# Patient Record
Sex: Female | Born: 1990 | Race: Black or African American | Hispanic: No | Marital: Single | Smoking: Never smoker
Health system: Southern US, Community
[De-identification: ages and names within clinical notes are randomized; demographics above are authoritative.]

## PROBLEM LIST (undated history)

## (undated) DIAGNOSIS — D573 Sickle-cell trait: Secondary | ICD-10-CM

---

## 2012-02-19 ENCOUNTER — Emergency Department: Payer: Self-pay | Admitting: Unknown Physician Specialty

## 2012-02-19 LAB — CBC
HGB: 12.8 g/dL (ref 12.0–16.0)
MCH: 30 pg (ref 26.0–34.0)
MCHC: 35.1 g/dL (ref 32.0–36.0)
Platelet: 238 10*3/uL (ref 150–440)
RBC: 4.26 10*6/uL (ref 3.80–5.20)

## 2014-04-02 ENCOUNTER — Emergency Department: Payer: Self-pay | Admitting: Emergency Medicine

## 2014-04-02 LAB — URINALYSIS, COMPLETE
BILIRUBIN, UR: NEGATIVE
Glucose,UR: NEGATIVE mg/dL (ref 0–75)
Ketone: NEGATIVE
LEUKOCYTE ESTERASE: NEGATIVE
NITRITE: POSITIVE
Ph: 5 (ref 4.5–8.0)
Protein: NEGATIVE
SPECIFIC GRAVITY: 1.016 (ref 1.003–1.030)
Squamous Epithelial: 2

## 2014-04-02 LAB — PREGNANCY, URINE: PREGNANCY TEST, URINE: NEGATIVE m[IU]/mL

## 2014-04-02 LAB — GC/CHLAMYDIA PROBE AMP

## 2014-04-02 LAB — WET PREP, GENITAL

## 2014-12-23 ENCOUNTER — Emergency Department: Payer: Self-pay | Admitting: Emergency Medicine

## 2015-04-21 ENCOUNTER — Emergency Department: Admission: EM | Admit: 2015-04-21 | Discharge: 2015-04-21 | Discharge status: Absent without leave | Payer: Self-pay

## 2015-07-14 ENCOUNTER — Emergency Department
Admission: EM | Admit: 2015-07-14 | Discharge: 2015-07-14 | Disposition: A | Discharge status: Home / Self Care | Payer: Medicaid Other | Attending: Emergency Medicine | Admitting: Emergency Medicine

## 2015-07-14 ENCOUNTER — Encounter: Payer: Self-pay | Admitting: Emergency Medicine

## 2015-07-14 DIAGNOSIS — N390 Urinary tract infection, site not specified: Secondary | ICD-10-CM

## 2015-07-14 DIAGNOSIS — Z3A01 Less than 8 weeks gestation of pregnancy: Secondary | ICD-10-CM | POA: Diagnosis not present

## 2015-07-14 DIAGNOSIS — O9989 Other specified diseases and conditions complicating pregnancy, childbirth and the puerperium: Secondary | ICD-10-CM | POA: Diagnosis present

## 2015-07-14 DIAGNOSIS — Z3491 Encounter for supervision of normal pregnancy, unspecified, first trimester: Secondary | ICD-10-CM

## 2015-07-14 DIAGNOSIS — O2341 Unspecified infection of urinary tract in pregnancy, first trimester: Secondary | ICD-10-CM | POA: Diagnosis not present

## 2015-07-14 LAB — URINALYSIS COMPLETE WITH MICROSCOPIC (ARMC ONLY)
BILIRUBIN URINE: NEGATIVE
Glucose, UA: NEGATIVE mg/dL
Hgb urine dipstick: NEGATIVE
Ketones, ur: NEGATIVE mg/dL
Nitrite: NEGATIVE
PH: 7 (ref 5.0–8.0)
Protein, ur: NEGATIVE mg/dL
Specific Gravity, Urine: 1.011 (ref 1.005–1.030)

## 2015-07-14 LAB — POCT PREGNANCY, URINE: Preg Test, Ur: POSITIVE — AB

## 2015-07-14 MED ORDER — LIDOCAINE HCL (PF) 1 % IJ SOLN
INTRAMUSCULAR | Status: AC
  Administered 2015-07-14: 2.1 mL
  Filled 2015-07-14: qty 5

## 2015-07-14 MED ORDER — CEPHALEXIN 500 MG PO CAPS: 500.0000 mg | ORAL_CAPSULE | Freq: Three times a day (TID) | ORAL | Status: AC

## 2015-07-14 MED ORDER — GNP PRENATAL VITAMINS 28-0.8 MG PO TABS: 1.0000 | ORAL_TABLET | Freq: Every day | ORAL | Status: DC

## 2015-07-14 MED ORDER — CEFTRIAXONE SODIUM 1 G IJ SOLR
1.0000 g | Freq: Once | INTRAMUSCULAR | Status: AC
  Administered 2015-07-14: 1 g via INTRAMUSCULAR
  Filled 2015-07-14: qty 10

## 2015-07-14 NOTE — Discharge Instructions (Signed)
Take keflex three times daily for 3 days.   Take prenatal vitamins.   See your OB doctor.   Return to ER if you have worse abdominal pain, unable to urinate, fever, vomiting.

## 2015-07-14 NOTE — ED Notes (Signed)
MD at bedside to assess patient and use bedside ultrasound.

## 2015-07-14 NOTE — ED Provider Notes (Signed)
CSN: [id number]     Arrival date & time 07/14/15  0919 History   First MD Initiated Contact with Patient 07/14/15 1007     Chief Complaint  Patient presents with  . Abdominal Pain  . Possible Pregnancy     (Consider location/radiation/quality/duration/timing/severity/associated sxs/prior Treatment) The history is provided by the patient.  Laura White is a 24 y.o. female otherwise healthy here presenting with lower abdominal pain, dysuria. For abdominal cramps for the last several days. Also noticed some dysuria and suprapubic pain. Denies any blood in urine or flank pain. LMP beginning of September. Denies vaginal bleeding or discharge. Doesn't know she is pregnant, has been using nuva ring but took it out several days ago since she thought she is going to have a period.      History reviewed. No pertinent past medical history. History reviewed. No pertinent past surgical history. No family history on file. Social History  Substance Use Topics  . Smoking status: Never Smoker   . Smokeless tobacco: None  . Alcohol Use: No   OB History    No data available     Review of Systems  Gastrointestinal: Positive for abdominal pain.  Genitourinary: Positive for dysuria.  All other systems reviewed and are negative.     Allergies  Review of patient's allergies indicates no known allergies.  Home Medications   Prior to Admission medications   Medication Sig Start Date End Date Taking? Authorizing Provider  cephALEXin (KEFLEX) 500 MG capsule Take 1 capsule (500 mg total) by mouth 3 (three) times daily. 07/14/15 07/24/15  Richardean Canal, MD  Prenatal Vit-Fe Fumarate-FA [facility] PRENATAL VITAMINS) 28-0.8 MG TABS Take 1 tablet by mouth daily. 07/14/15   Richardean Canal, MD   BP 120/70 mmHg  Pulse 62  Temp(Src) 98.5 F (36.9 C) (Oral)  Resp 18  Ht  (1.6 m)  Wt 143 lb (64.864 kg)  BMI 25.34 kg/m2  SpO2 100%  LMP 06/12/2015 (Approximate) Physical Exam  Constitutional: She is oriented  to person, place, and time. She appears well-developed and well-nourished.  HENT:  Head: Normocephalic.  Mouth/Throat: Oropharynx is clear and moist.  Eyes: Conjunctivae are normal. Pupils are equal, round, and reactive to light.  Neck: Normal range of motion. Neck supple.  Cardiovascular: Normal rate, regular rhythm and normal heart sounds.   Pulmonary/Chest: Effort normal and breath sounds normal. No respiratory distress. She has no wheezes. She has no rales.  Abdominal: Soft. Bowel sounds are normal.  Minimal suprapubic tenderness. No CVAT   Musculoskeletal: Normal range of motion. She exhibits no edema or tenderness.  Neurological: She is alert and oriented to person, place, and time.  Skin: Skin is warm and dry.  Psychiatric: She has a normal mood and affect. Her behavior is normal. Judgment and thought content normal.  Nursing note and vitals reviewed.   ED Course  Procedures (including critical care time)   EMERGENCY DEPARTMENT [place] PREGNANCY "Study: Limited Ultrasound of the Pelvis"  INDICATIONS:Pregnancy(required) Multiple views of the uterus and pelvic cavity are obtained with a multi-frequency probe.  APPROACH:Transabdominal   PERFORMED BY: Myself  IMAGES ARCHIVED?: Yes  LIMITATIONS: Body habitus  PREGNANCY FREE FLUID: None  PREGNANCY UTERUS FINDINGS:Uterus enlarged and Gestational sac noted ADNEXAL FINDINGS:Left ovary not seen and Right ovary not seen  PREGNANCY FINDINGS: Intrauterine gestational sac noted  INTERPRETATION: Viable intrauterine pregnancy  GESTATIONAL AGE, ESTIMATE: 5 weeks  FETAL HEART RATE: unable  COMMENT(Estimate of Gestational Age):  Labs Review Labs Reviewed  URINALYSIS COMPLETEWITH MICROSCOPIC ([facility] ONLY) - Abnormal; Notable for the following:    Color, Urine YELLOW (*)    APPearance CLEAR (*)    Leukocytes, UA 2+ (*)    Bacteria, UA RARE (*)    Squamous Epithelial / LPF 0-5 (*)    All other components within normal  limits  POCT PREGNANCY, URINE - Abnormal; Notable for the following:    Preg Test, Ur POSITIVE (*)    All other components within normal limits    Imaging Review No results found. I have personally reviewed and evaluated these images and lab results as part of my medical decision-making.   EKG Interpretation None      MDM   Final diagnoses:  UTI (lower urinary tract infection)  First trimester pregnancy    Laura White is a 24 y.o. female here with dysuria. UCG positive. UA + UTI. Bedside US confirmed gestational sac, unable to visualize fetus, likely since she has early pregnancy. No vaginal bleeding or discharge, no signs of threatened abortion. Appears hydrated. No signs of pyelo and vitals stable. Given rocephin shot and will dc with keflex. Recommend prenatal vitamins, will have her go back to see her OB.      Richardean Canal, MD 07/14/15 1159

## 2015-07-14 NOTE — ED Notes (Signed)
Pt wants to confirm pregnancy with testing today and also c/o abd pain. No acute distress noted.

## 2015-07-20 ENCOUNTER — Encounter: Payer: Self-pay | Admitting: *Deleted

## 2015-07-20 ENCOUNTER — Emergency Department
Admission: EM | Admit: 2015-07-20 | Discharge: 2015-07-21 | Disposition: A | Discharge status: Home / Self Care | Payer: Medicaid Other | Attending: Emergency Medicine | Admitting: Emergency Medicine

## 2015-07-20 DIAGNOSIS — Z3A01 Less than 8 weeks gestation of pregnancy: Secondary | ICD-10-CM | POA: Diagnosis not present

## 2015-07-20 DIAGNOSIS — N760 Acute vaginitis: Secondary | ICD-10-CM

## 2015-07-20 DIAGNOSIS — Z792 Long term (current) use of antibiotics: Secondary | ICD-10-CM | POA: Diagnosis not present

## 2015-07-20 DIAGNOSIS — O9989 Other specified diseases and conditions complicating pregnancy, childbirth and the puerperium: Secondary | ICD-10-CM | POA: Diagnosis present

## 2015-07-20 DIAGNOSIS — Z79899 Other long term (current) drug therapy: Secondary | ICD-10-CM | POA: Diagnosis not present

## 2015-07-20 DIAGNOSIS — O23591 Infection of other part of genital tract in pregnancy, first trimester: Secondary | ICD-10-CM | POA: Insufficient documentation

## 2015-07-20 DIAGNOSIS — B9689 Other specified bacterial agents as the cause of diseases classified elsewhere: Secondary | ICD-10-CM

## 2015-07-20 DIAGNOSIS — Z3491 Encounter for supervision of normal pregnancy, unspecified, first trimester: Secondary | ICD-10-CM

## 2015-07-20 DIAGNOSIS — R1030 Lower abdominal pain, unspecified: Secondary | ICD-10-CM

## 2015-07-20 LAB — HCG, QUANTITATIVE, PREGNANCY: hCG, Beta Chain, Quant, S: 65645 m[IU]/mL — ABNORMAL HIGH (ref <5)

## 2015-07-20 LAB — WET PREP, GENITAL
TRICH WET PREP: NONE SEEN
Yeast Wet Prep HPF POC: NONE SEEN

## 2015-07-20 LAB — URINALYSIS COMPLETE WITH MICROSCOPIC (ARMC ONLY)
BACTERIA UA: NONE SEEN
Bilirubin Urine: NEGATIVE
Glucose, UA: NEGATIVE mg/dL
Hgb urine dipstick: NEGATIVE
KETONES UR: NEGATIVE mg/dL
Nitrite: NEGATIVE
PH: 6 (ref 5.0–8.0)
Protein, ur: NEGATIVE mg/dL
RBC / HPF: NONE SEEN RBC/hpf (ref 0–5)
Specific Gravity, Urine: 1.011 (ref 1.005–1.030)

## 2015-07-20 MED ORDER — METRONIDAZOLE 500 MG PO TABS: 500.0000 mg | ORAL_TABLET | Freq: Two times a day (BID) | ORAL | Status: DC

## 2015-07-20 NOTE — ED Provider Notes (Signed)
[facility] Emergency Department Provider Note  ____________________________________________  Time seen: Approximately 9:24 PM  I have reviewed the triage vital signs and the nursing notes.   HISTORY  Chief Complaint Abdominal Pain    HPI Laura White is a 24 y.o. female G7P2Ab4 (1 spontaneous, 3 elective) at approximately 5 weeks who presents with intermittent sharp severe pelvic pains and persistent pressure.  She was seen several days ago and diagnosed with urinary tract infection.  She has completed a course of antibiotics but continues feeling somewhat.  She denies vaginal bleeding or discharge.  She has not been able to see an OB doctor because they will not see her until she is at 8 weeks.  She denies fever/chills, chest pain, shortness of breath, nausea/vomiting, dysuria (it resolved after the Keflex).  She states that the symptoms of a persistent for several days though they occur intermittently, particularly the sharp pain.  Nothing makes it better and nothing makes it worse.   History reviewed. No pertinent past medical history.  There are no active problems to display for this patient.   Past Surgical History  Procedure Laterality Date  . Cesarean section      Current Outpatient Rx  Name  Route  Sig  Dispense  Refill  . cephALEXin (KEFLEX) 500 MG capsule   Oral   Take 1 capsule (500 mg total) by mouth 3 (three) times daily.   15 capsule   0   . metroNIDAZOLE (FLAGYL) 500 MG tablet   Oral   Take 1 tablet (500 mg total) by mouth 2 (two) times daily.   14 tablet   0   . Prenatal Vit-Fe Fumarate-FA ([facility] PRENATAL VITAMINS) 28-0.8 MG TABS   Oral   Take 1 tablet by mouth daily.   30 tablet   0     Allergies Review of patient's allergies indicates no known allergies.  No family history on file.  Social History Social History  Substance Use Topics  . Smoking status: Never Smoker   . Smokeless tobacco: None  . Alcohol Use: No     Review of Systems Constitutional: No fever/chills Eyes: No visual changes. ENT: No sore throat. Cardiovascular: Denies chest pain. Respiratory: Denies shortness of breath. Gastrointestinal: Intermittent sharp pelvic or lower abdominal pain.  No nausea, no vomiting.  No diarrhea.  No constipation. Genitourinary: Negative for dysuria.  Denies vaginal bleeding and vaginal discharge Musculoskeletal: Negative for back pain. Skin: Negative for rash. Neurological: Negative for headaches, focal weakness or numbness.  10-point ROS otherwise negative.  ____________________________________________   PHYSICAL EXAM:  VITAL SIGNS: ED Triage Vitals  Enc Vitals Group     BP 07/20/15 1827 120/60 mmHg     Pulse Rate 07/20/15 1827 74     Resp 07/20/15 1827 18     Temp 07/20/15 1827 99.1 F (37.3 C)     Temp Source 07/20/15 1827 Oral     SpO2 07/20/15 1827 99 %     Weight 07/20/15 1827 143 lb (64.864 kg)     Height 07/20/15 1827 5\' 3"  (1.6 m)     Head Cir --      Peak Flow --      Pain Score 07/20/15 1827 6     Pain Loc --      Pain Edu? --      Excl. in GC? --     Constitutional: Alert and oriented. Well appearing and in no acute distress. Eyes: Conjunctivae are normal. PERRL. EOMI. Head:  Atraumatic. Nose: No congestion/rhinnorhea. Mouth/Throat: Mucous membranes are moist.  Oropharynx non-erythematous. Neck: No stridor.   Cardiovascular: Normal rate, regular rhythm. Grossly normal heart sounds.  Good peripheral circulation. Respiratory: Normal respiratory effort.  No retractions. Lungs CTAB. Gastrointestinal: Soft and nontender. No distention. No abdominal bruits. No CVA tenderness. Genitourinary: Normal external exam.  Moderate amount of whitish discharge.  No cervicitis.  Cervix is closed.  No blood.  Normal bimanual exam with no cervical motion tenderness nor adnexal tenderness. Musculoskeletal: No lower extremity tenderness nor edema.  No joint effusions. Neurologic:  Normal  speech and language. No gross focal neurologic deficits are appreciated.  Skin:  Skin is warm, dry and intact. No rash noted. Psychiatric: Mood and affect are normal. Speech and behavior are normal.  ____________________________________________   LABS (all labs ordered are listed, but only abnormal results are displayed)  Labs Reviewed  WET PREP, GENITAL - Abnormal; Notable for the following:    Clue Cells Wet Prep HPF POC MODERATE (*)    WBC, Wet Prep HPF POC MODERATE (*)    All other components within normal limits  URINALYSIS COMPLETEWITH MICROSCOPIC ([facility] ONLY) - Abnormal; Notable for the following:    Color, Urine STRAW (*)    APPearance CLEAR (*)    Leukocytes, UA TRACE (*)    Squamous Epithelial / LPF 6-30 (*)    All other components within normal limits  HCG, QUANTITATIVE, PREGNANCY - Abnormal; Notable for the following:    hCG, Beta Chain, Quant, [place] [address] (*)    All other components within normal limits  CHLAMYDIA/NGC RT PCR ([facility] ONLY)  URINE CULTURE   ____________________________________________  EKG  Not indicated ____________________________________________  RADIOLOGY   No results found.  ____________________________________________   PROCEDURES  Procedure(s) performed: None  Critical Care performed: No ____________________________________________   INITIAL IMPRESSION / ASSESSMENT AND PLAN / ED COURSE  Pertinent labs & imaging results that were available during my care of the patient were reviewed by me and considered in my medical decision making (see chart for details).  The patient is well-appearing and in no acute distress with normal vitals.  Given that this is a repeat visit with some persistent issues in the setting of an apparently resolving urinary tract infection, I will do a pelvic exam and obtain swabs and will get an official transvaginal ultrasound after checking the hCG.  ----------------------------------------- 11:45 PM  on 07/20/2015 -----------------------------------------  Transferring care to Dr. Dolores Frame to follow-up GC/Chlamydia results and ultrasound report. ____________________________________________  FINAL CLINICAL IMPRESSION(S) / ED DIAGNOSES  Final diagnoses:  Bacterial vaginosis  Lower abdominal pain      NEW MEDICATIONS STARTED DURING THIS VISIT:  New Prescriptions   METRONIDAZOLE (FLAGYL) 500 MG TABLET    Take 1 tablet (500 mg total) by mouth 2 (two) times daily.     Loleta Rose, MD 07/20/15 [phone]

## 2015-07-20 NOTE — ED Notes (Signed)
Pt reports lower midline abdominal pain, since Friday, "feels like more pressure and sharp pains". Dx with UTI on the 7th, has completed abx prescriptions. Pt denies vaginal bleeding or discharge. Pt estimates she is 4-[redacted] weeks pregnant.

## 2015-07-21 ENCOUNTER — Emergency Department: Payer: Medicaid Other

## 2015-07-21 LAB — CHLAMYDIA/NGC RT PCR (ARMC ONLY)
CHLAMYDIA TR: NOT DETECTED
N gonorrhoeae: NOT DETECTED

## 2015-07-21 NOTE — Discharge Instructions (Signed)
1. Take antibiotic as prescribed (Flagyl 500 mg twice daily 7 days). 2. Return to the ER for worsening symptoms, vaginal bleeding, persistent vomiting or other concerns.  Bacterial Vaginosis Bacterial vaginosis is a vaginal infection that occurs when the normal balance of bacteria in the vagina is disrupted. It results from an overgrowth of certain bacteria. This is the most common vaginal infection in women of childbearing age. Treatment is important to prevent complications, especially in pregnant women, as it can cause a premature delivery. CAUSES  Bacterial vaginosis is caused by an increase in harmful bacteria that are normally present in smaller amounts in the vagina. Several different kinds of bacteria can cause bacterial vaginosis. However, the reason that the condition develops is not fully understood. RISK FACTORS Certain activities or behaviors can put you at an increased risk of developing bacterial vaginosis, including:  Having a new sex partner or multiple sex partners.  Douching.  Using an intrauterine device (IUD) for contraception. Women do not get bacterial vaginosis from toilet seats, bedding, swimming pools, or contact with objects around them. SIGNS AND SYMPTOMS  Some women with bacterial vaginosis have no signs or symptoms. Common symptoms include:  Grey vaginal discharge.  A fishlike odor with discharge, especially after sexual intercourse.  Itching or burning of the vagina and vulva.  Burning or pain with urination. DIAGNOSIS  Your health care provider will take a medical history and examine the vagina for signs of bacterial vaginosis. A sample of vaginal fluid may be taken. Your health care provider will look at this sample under a microscope to check for bacteria and abnormal cells. A vaginal pH test may also be done.  TREATMENT  Bacterial vaginosis may be treated with antibiotic medicines. These may be given in the form of a pill or a vaginal cream. A second  round of antibiotics may be prescribed if the condition comes back after treatment. Because bacterial vaginosis increases your risk for sexually transmitted diseases, getting treated can help reduce your risk for chlamydia, gonorrhea, HIV, and herpes. HOME CARE INSTRUCTIONS   Only take over-the-counter or prescription medicines as directed by your health care provider.  If antibiotic medicine was prescribed, take it as directed. Make sure you finish it even if you start to feel better.  Tell all sexual partners that you have a vaginal infection. They should see their health care provider and be treated if they have problems, such as a mild rash or itching.  During treatment, it is important that you follow these instructions:  Avoid sexual activity or use condoms correctly.  Do not douche.  Avoid alcohol as directed by your health care provider.  Avoid breastfeeding as directed by your health care provider. SEEK MEDICAL CARE IF:   Your symptoms are not improving after 3 days of treatment.  You have increased discharge or pain.  You have a fever. MAKE SURE YOU:   Understand these instructions.  Will watch your condition.  Will get help right away if you are not doing well or get worse. FOR MORE INFORMATION  Centers for Disease Control and Prevention, Division of STD Prevention: [employer]s.[employer].za American Sexual Health Association (ASHA): www.ashastd.org    This information is not intended to replace advice given to you by your health care provider. Make sure you discuss any questions you have with your health care provider.   Document Released: 09/23/2005 Document Revised: 10/14/2014 Document Reviewed: 05/05/2013 Elsevier Interactive Patient Education 2016 [employer].  First Trimester of Pregnancy The first trimester of  pregnancy is from week 1 until the end of week 12 (months 1 through 3). During this time, your baby will begin to develop inside you. At 6-8 weeks, the eyes  and face are formed, and the heartbeat can be seen on ultrasound. At the end of 12 weeks, all the baby's organs are formed. Prenatal care is all the medical care you receive before the birth of your baby. Make sure you get good prenatal care and follow all of your doctor's instructions. HOME CARE  Medicines  Take medicine only as told by your doctor. Some medicines are safe and some are not during pregnancy.  Take your prenatal vitamins as told by your doctor.  Take medicine that helps you poop (stool softener) as needed if your doctor says it is okay. Diet  Eat regular, healthy meals.  Your doctor will tell you the amount of weight gain that is right for you.  Avoid raw meat and uncooked cheese.  If you feel sick to your stomach (nauseous) or throw up (vomit):  Eat 4 or 5 small meals a day instead of 3 large meals.  Try eating a few soda crackers.  Drink liquids between meals instead of during meals.  If you have a hard time pooping (constipation):  Eat high-fiber foods like fresh vegetables, fruit, and whole grains.  Drink enough fluids to keep your pee (urine) clear or pale yellow. Activity and Exercise  Exercise only as told by your doctor. Stop exercising if you have cramps or pain in your lower belly (abdomen) or low back.  Try to avoid standing for long periods of time. Move your legs often if you must stand in one place for a long time.  Avoid heavy lifting.  Wear low-heeled shoes. Sit and stand up straight.  You can have sex unless your doctor tells you not to. Relief of Pain or Discomfort  Wear a good support bra if your breasts are sore.  Take warm water baths (sitz baths) to soothe pain or discomfort caused by hemorrhoids. Use hemorrhoid cream if your doctor says it is okay.  Rest with your legs raised if you have leg cramps or low back pain.  Wear support hose if you have puffy, bulging veins (varicose veins) in your legs. Raise (elevate) your feet for 15  minutes, 3-4 times a day. Limit salt in your diet. Prenatal Care  Schedule your prenatal visits by the twelfth week of pregnancy.  Write down your questions. Take them to your prenatal visits.  Keep all your prenatal visits as told by your doctor. Safety  Wear your seat belt at all times when driving.  Make a list of emergency phone numbers. The list should include numbers for family, friends, the hospital, and police and fire departments. General Tips  Ask your doctor for a referral to a local prenatal class. Begin classes no later than at the start of month 6 of your pregnancy.  Ask for help if you need counseling or help with nutrition. Your doctor can give you advice or tell you where to go for help.  Do not use hot tubs, steam rooms, or saunas.  Do not douche or use tampons or scented sanitary pads.  Do not cross your legs for long periods of time.  Avoid litter boxes and soil used by cats.  Avoid all smoking, herbs, and alcohol. Avoid drugs not approved by your doctor.  Do not use any tobacco products, including cigarettes, chewing tobacco, and electronic cigarettes. If you  need help quitting, ask your doctor. You may get counseling or other support to help you quit.  Visit your dentist. At home, brush your teeth with a soft toothbrush. Be gentle when you floss. GET HELP IF:  You are dizzy.  You have mild cramps or pressure in your lower belly.  You have a nagging pain in your belly area.  You continue to feel sick to your stomach, throw up, or have watery poop (diarrhea).  You have a bad smelling fluid coming from your vagina.  You have pain with peeing (urination).  You have increased puffiness (swelling) in your face, hands, legs, or ankles. GET HELP RIGHT AWAY IF:   You have a fever.  You are leaking fluid from your vagina.  You have spotting or bleeding from your vagina.  You have very bad belly cramping or pain.  You gain or lose weight  rapidly.  You throw up blood. It may look like coffee grounds.  You are around people who have [place] measles, fifth disease, or chickenpox.  You have a very bad headache.  You have shortness of breath.  You have any kind of trauma, such as from a fall or a car accident.   This information is not intended to replace advice given to you by your health care provider. Make sure you discuss any questions you have with your health care provider.   Document Released: 03/11/2008 Document Revised: 10/14/2014 Document Reviewed: 08/03/2013 Elsevier Interactive Patient Education 2016 Elsevier Inc.  Abdominal Pain, Adult Many things can cause abdominal pain. Usually, abdominal pain is not caused by a disease and will improve without treatment. It can often be observed and treated at home. Your health care provider will do a physical exam and possibly order blood tests and X-rays to help determine the seriousness of your pain. However, in many cases, more time must pass before a clear cause of the pain can be found. Before that point, your health care provider may not know if you need more testing or further treatment. HOME CARE INSTRUCTIONS Monitor your abdominal pain for any changes. The following actions may help to alleviate any discomfort you are experiencing:  Only take over-the-counter or prescription medicines as directed by your health care provider.  Do not take laxatives unless directed to do so by your health care provider.  Try a clear liquid diet (broth, tea, or water) as directed by your health care provider. Slowly move to a bland diet as tolerated. SEEK MEDICAL CARE IF:  You have unexplained abdominal pain.  You have abdominal pain associated with nausea or diarrhea.  You have pain when you urinate or have a bowel movement.  You experience abdominal pain that wakes you in the night.  You have abdominal pain that is worsened or improved by eating food.  You have abdominal  pain that is worsened with eating fatty foods.  You have a fever. SEEK IMMEDIATE MEDICAL CARE IF:  Your pain does not go away within 2 hours.  You keep throwing up (vomiting).  Your pain is felt only in portions of the abdomen, such as the right side or the left lower portion of the abdomen.  You pass bloody or black tarry stools. MAKE SURE YOU:  Understand these instructions.  Will watch your condition.  Will get help right away if you are not doing well or get worse.   This information is not intended to replace advice given to you by your health care provider. Make sure  you discuss any questions you have with your health care provider.   Document Released: 07/03/2005 Document Revised: 06/14/2015 Document Reviewed: 06/02/2013 Elsevier Interactive Patient Education [employer]

## 2015-07-21 NOTE — ED Provider Notes (Signed)
-----------------------------------------   2:23 AM on 07/21/2015 -----------------------------------------  Chlamydia and gonorrhea results negative.  Ultrasound OB interpreted per Dr. Andria MeuseStevens: Single intrauterine pregnancy identified. Estimated gestational age by crown-rump length is 5 weeks 6 days. No acute complication is Suggested.  Updated patient of laboratory and ultrasound results. Strict return precautions given. Patient verbalizes understanding and agrees with plan of care.  [name], MD 07/21/15 [phone]

## 2015-07-22 LAB — URINE CULTURE
Culture: NO GROWTH
Special Requests: NORMAL

## 2015-08-15 DIAGNOSIS — D573 Sickle-cell trait: Secondary | ICD-10-CM | POA: Insufficient documentation

## 2015-09-02 ENCOUNTER — Encounter: Payer: Self-pay | Admitting: Emergency Medicine

## 2015-09-02 ENCOUNTER — Emergency Department
Admission: EM | Admit: 2015-09-02 | Discharge: 2015-09-02 | Discharge status: Against Medical Advice | Payer: Medicaid Other | Attending: Emergency Medicine | Admitting: Emergency Medicine

## 2015-09-02 DIAGNOSIS — O209 Hemorrhage in early pregnancy, unspecified: Secondary | ICD-10-CM | POA: Diagnosis not present

## 2015-09-02 DIAGNOSIS — Z3A11 11 weeks gestation of pregnancy: Secondary | ICD-10-CM | POA: Diagnosis not present

## 2015-09-02 HISTORY — DX: Sickle-cell trait: D57.3

## 2015-09-02 LAB — URINALYSIS COMPLETE WITH MICROSCOPIC (ARMC ONLY)
BILIRUBIN URINE: NEGATIVE
Bacteria, UA: NONE SEEN
Glucose, UA: NEGATIVE mg/dL
HGB URINE DIPSTICK: NEGATIVE
KETONES UR: NEGATIVE mg/dL
LEUKOCYTES UA: NEGATIVE
NITRITE: NEGATIVE
PH: 7 (ref 5.0–8.0)
Protein, ur: NEGATIVE mg/dL
SPECIFIC GRAVITY, URINE: 1.013 (ref 1.005–1.030)

## 2015-09-02 NOTE — ED Notes (Signed)
Called x 3 from waiting room, no answer. Appears to have LWBS.

## 2015-09-02 NOTE — ED Notes (Signed)
Pt states she is approximately [redacted] weeks pregnant and started bleeding this morning while at work. Pt states "I felt one gush this morning and then it has been when I wipe since then." Pt denies abdominal cramping but does complain of lower back pain.

## 2015-10-21 ENCOUNTER — Emergency Department
Admission: EM | Admit: 2015-10-21 | Discharge: 2015-10-21 | Disposition: A | Discharge status: Home / Self Care | Payer: Medicaid Other | Attending: Emergency Medicine | Admitting: Emergency Medicine

## 2015-10-21 ENCOUNTER — Encounter: Payer: Self-pay | Admitting: Emergency Medicine

## 2015-10-21 DIAGNOSIS — Z79899 Other long term (current) drug therapy: Secondary | ICD-10-CM | POA: Diagnosis not present

## 2015-10-21 DIAGNOSIS — Z3A19 19 weeks gestation of pregnancy: Secondary | ICD-10-CM | POA: Insufficient documentation

## 2015-10-21 DIAGNOSIS — O4692 Antepartum hemorrhage, unspecified, second trimester: Secondary | ICD-10-CM

## 2015-10-21 DIAGNOSIS — O209 Hemorrhage in early pregnancy, unspecified: Secondary | ICD-10-CM | POA: Diagnosis not present

## 2015-10-21 LAB — HCG, QUANTITATIVE, PREGNANCY: HCG, BETA CHAIN, QUANT, S: 58506 m[IU]/mL — AB (ref <5)
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
## 2015-10-21 NOTE — Discharge Instructions (Signed)
Vaginal Bleeding During Pregnancy, Second Trimester A small amount of bleeding (spotting) from the vagina is relatively common in pregnancy. It usually stops on its own. Various things can cause bleeding or spotting in pregnancy. Some bleeding may be related to the pregnancy, and some may not. Sometimes the bleeding is normal and is not a problem. However, bleeding can also be a sign of something serious. Be sure to tell your health care provider about any vaginal bleeding right away. Some possible causes of vaginal bleeding during the second trimester include:  Infection, inflammation, or growths on the cervix.   The placenta may be partially or completely covering the opening of the cervix inside the uterus (placenta previa).  The placenta may have separated from the uterus (abruption of the placenta).   You may be having early (preterm) labor.   The cervix may not be strong enough to keep a baby inside the uterus (cervical insufficiency).   Tiny cysts may have developed in the uterus instead of pregnancy tissue (molar pregnancy). HOME CARE INSTRUCTIONS  Watch your condition for any changes. The following actions may help to lessen any discomfort you are feeling:  Follow your health care provider's instructions for limiting your activity. If your health care provider orders bed rest, you may need to stay in bed and only get up to use the bathroom. However, your health care provider may allow you to continue light activity.  If needed, make plans for someone to help with your regular activities and responsibilities while you are on bed rest.  Keep track of the number of pads you use each day, how often you change pads, and how soaked (saturated) they are. Write this down.  Do not use tampons. Do not douche.  Do not have sexual intercourse or orgasms until approved by your health care provider.  If you pass any tissue from your vagina, save the tissue so you can show it to your  health care provider.  Only take over-the-counter or prescription medicines as directed by your health care provider.  Do not take aspirin because it can make you bleed.  Do not exercise or perform any strenuous activities or heavy lifting without your health care provider's permission.  Keep all follow-up appointments as directed by your health care provider. SEEK MEDICAL CARE IF:  You have any vaginal bleeding during any part of your pregnancy.  You have cramps or labor pains.  You have a fever, not controlled by medicine. SEEK IMMEDIATE MEDICAL CARE IF:   You have severe cramps in your back or belly (abdomen).  You have contractions.  You have chills.  You pass large clots or tissue from your vagina.  Your bleeding increases.  You feel light-headed or weak, or you have fainting episodes.  You are leaking fluid or have a gush of fluid from your vagina. MAKE SURE YOU:  Understand these instructions.  Will watch your condition.  Will get help right away if you are not doing well or get worse.   This information is not intended to replace advice given to you by your health care provider. Make sure you discuss any questions you have with your health care provider.   Document Released: 07/03/2005 Document Revised: 09/28/2013 Document Reviewed: 05/31/2013 Elsevier Interactive Patient Education 2016 Elsevier Inc.  
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
## 2016-09-19 IMAGING — US US OB TRANSVAGINAL
1 series · 13 of 28 positions shown · non-contrast
Comparison: None.

CLINICAL DATA: Lower midline abdominal pain during pregnancy.
Patient was seen in the ER 6 days ago for urinary tract infection.
Estimated gestational age by LMP is 5 weeks 4 days. Quantitative
beta HCG is [DATE].

EXAM:
OBSTETRIC <14 WK US AND TRANSVAGINAL OB US
TECHNIQUE: Both transabdominal and transvaginal ultrasound examinations were
performed for complete evaluation of the gestation as well as the
maternal uterus, adnexal regions, and pelvic cul-de-sac.
Transvaginal technique was performed to assess early pregnancy.

[Series 1: us ob transvaginal · 0.20mm/px · 13 of 78 slices shown]
[im 3/78]
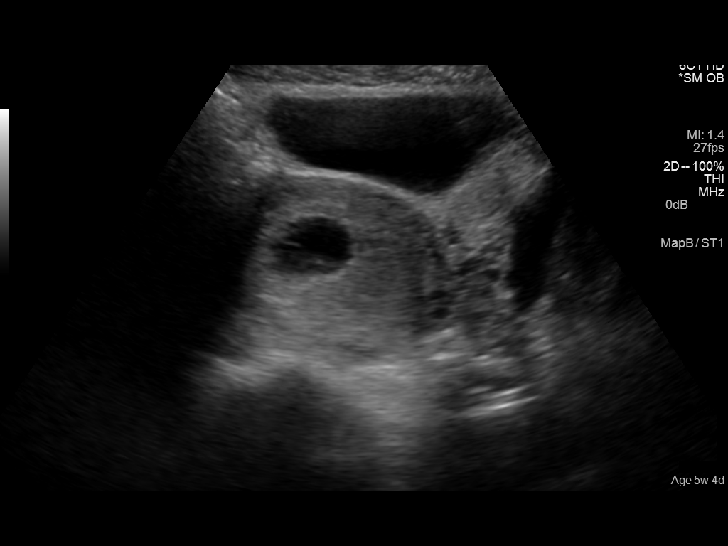
[im 9/78]
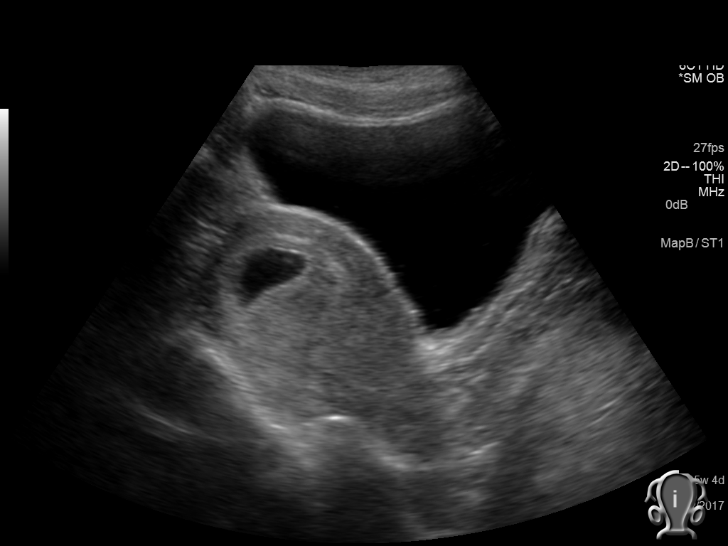
[im 15/78]
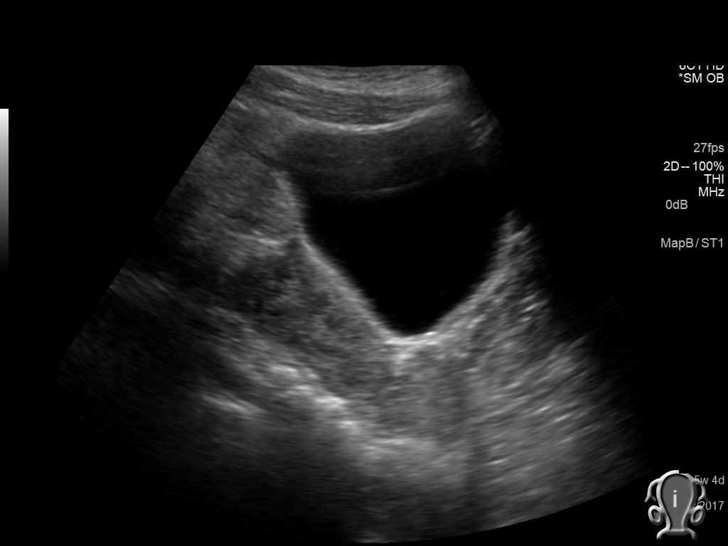
[im 20/78]
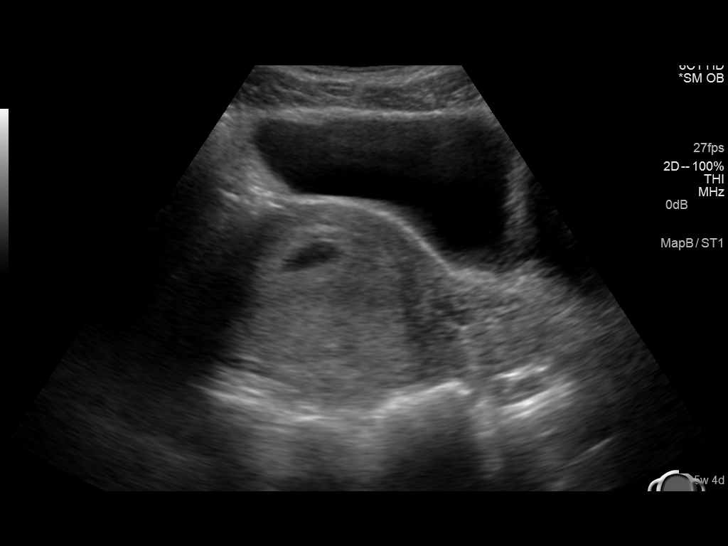
[im 26/78]
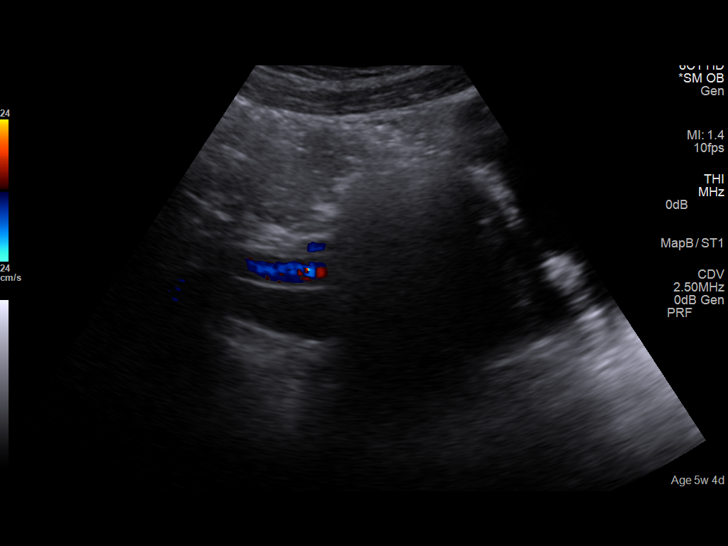
[im 32/78]
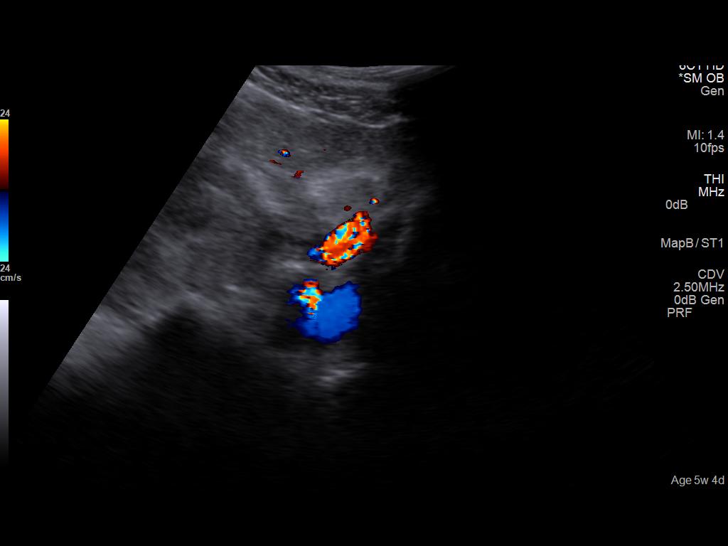
[im 40/78]
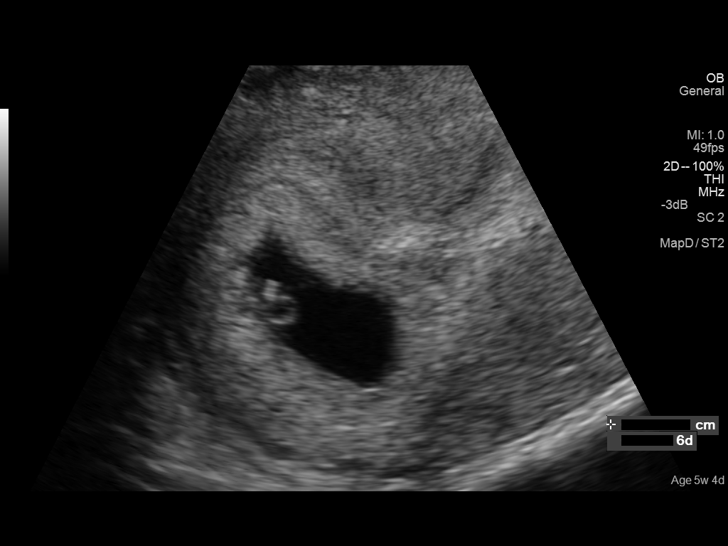
[im 46/78]
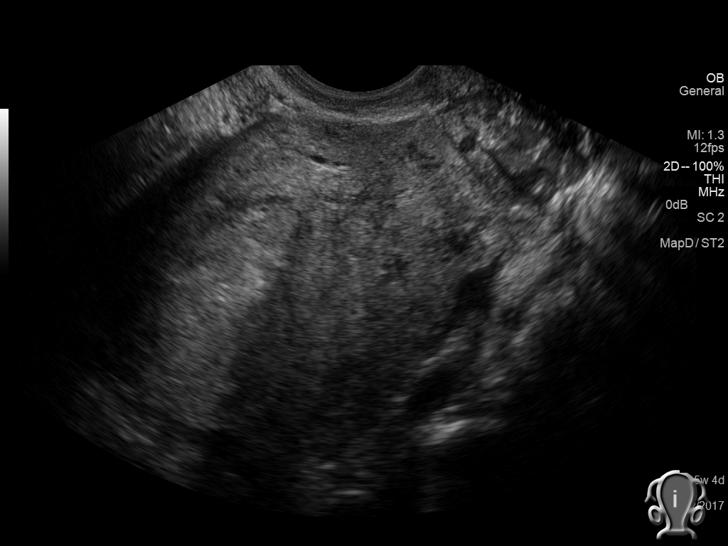
[im 52/78]
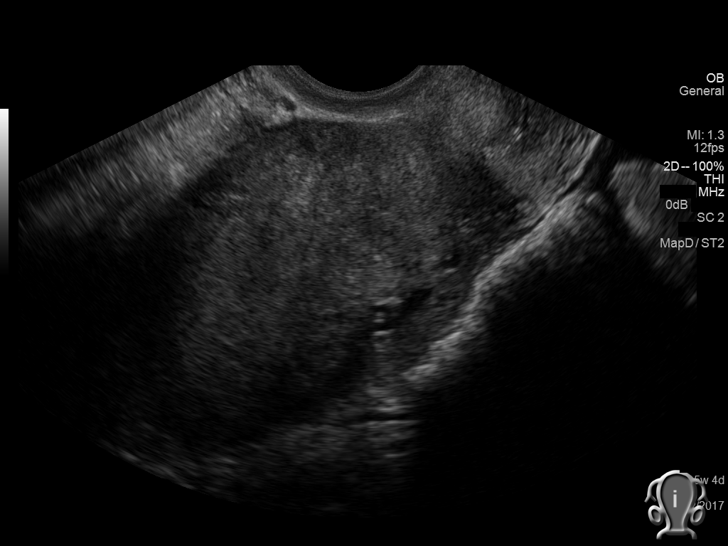
[im 58/78]
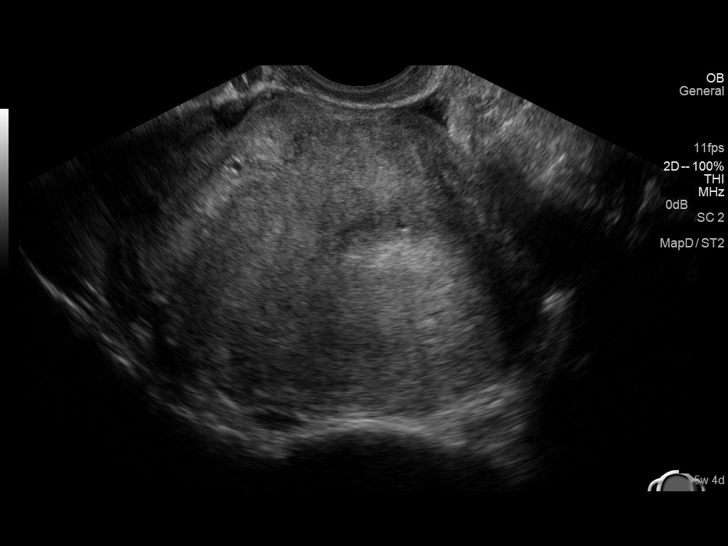
[im 63/78]
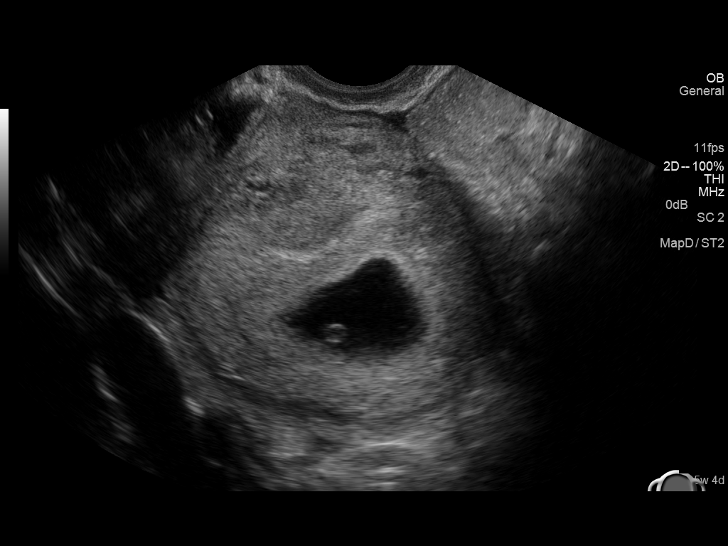
[im 69/78]
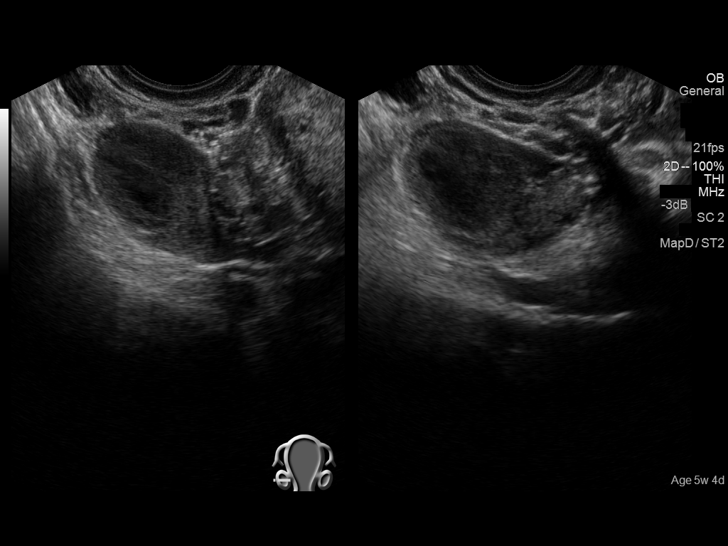
[im 75/78]
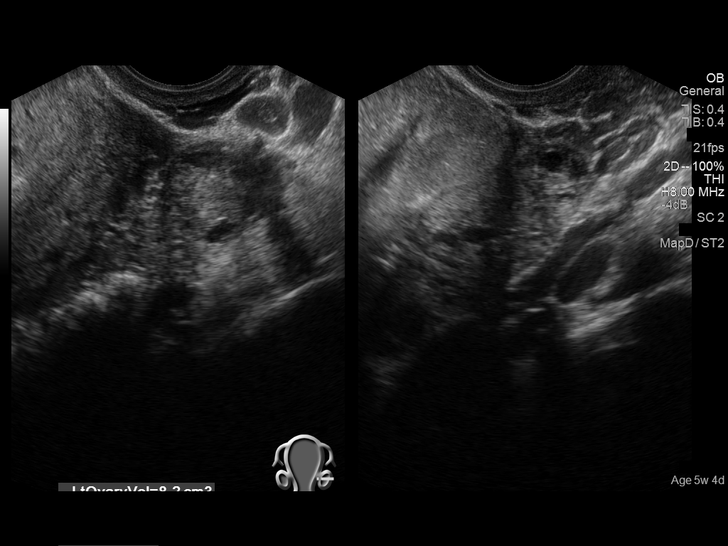

[13 of 28 positions shown; findings below may reference images not displayed]

FINDINGS: Intrauterine gestational sac: A single intrauterine gestational sac
is present.

Yolk sac:  Yolk sac is present.

Embryo:  Fetal pole is present.

Cardiac Activity: Fetal cardiac activity is observed.

Heart Rate: 104  bpm

CRL:  2.9  mm   5 w   6 d                  US EDC: 03/16/2016

Maternal uterus/adnexae: Uterus is anteverted. No myometrial mass
lesions identified. No significant subchorionic hemorrhage. Small
nabothian cysts in the cervix. Both ovaries are visualized and
appear normal. Probable corpus luteal cyst on the right. No free
pelvic fluid collections.
IMPRESSION: Single intrauterine pregnancy identified. Estimated gestational age
by crown-rump length is 5 weeks 6 days. No acute complication is
suggested.

## 2017-10-04 ENCOUNTER — Encounter: Payer: Self-pay | Admitting: *Deleted

## 2017-10-04 ENCOUNTER — Other Ambulatory Visit: Payer: Self-pay

## 2017-10-04 ENCOUNTER — Emergency Department
Admission: EM | Admit: 2017-10-04 | Discharge: 2017-10-04 | Disposition: A | Discharge status: Home / Self Care | Payer: Self-pay | Attending: Emergency Medicine | Admitting: Emergency Medicine

## 2017-10-04 DIAGNOSIS — K047 Periapical abscess without sinus: Secondary | ICD-10-CM | POA: Insufficient documentation

## 2017-10-04 MED ORDER — IBUPROFEN 800 MG PO TABS: 800.0000 mg | ORAL_TABLET | Freq: Three times a day (TID) | ORAL | 0 refills | Status: AC | PRN

## 2017-10-04 MED ORDER — TRAMADOL HCL 50 MG PO TABS: 50.0000 mg | ORAL_TABLET | Freq: Four times a day (QID) | ORAL | 0 refills | Status: AC | PRN

## 2017-10-04 MED ORDER — OXYCODONE-ACETAMINOPHEN 5-325 MG PO TABS
1.0000 | ORAL_TABLET | ORAL | Status: AC | PRN
  Administered 2017-10-04 (×2): 1 via ORAL
  Filled 2017-10-04 (×2): qty 1

## 2017-10-04 MED ORDER — AMOXICILLIN 500 MG PO CAPS: 500.0000 mg | ORAL_CAPSULE | Freq: Three times a day (TID) | ORAL | 0 refills | Status: AC

## 2017-10-04 MED ORDER — IBUPROFEN 400 MG PO TABS
400.0000 mg | ORAL_TABLET | Freq: Once | ORAL | Status: AC
  Administered 2017-10-04: 400 mg via ORAL
  Filled 2017-10-04: qty 1

## 2017-10-04 MED ORDER — AMOXICILLIN 500 MG PO CAPS
500.0000 mg | ORAL_CAPSULE | Freq: Three times a day (TID) | ORAL | Status: DC
  Administered 2017-10-04: 500 mg via ORAL
  Filled 2017-10-04 (×2): qty 1

## 2017-10-04 NOTE — Discharge Instructions (Signed)
OPTIONS FOR DENTAL FOLLOW UP CARE ° °[place] Department of Health and Human Services - Local Safety Net Dental Clinics °http://www.ncdhhs.gov/dph/oralhealth/services/safetynetclinics.htm °  °[facility] (336-562-3123) ° °[facility] [place] (919-933-9087) ° °[facility] ([phone] ext [phone]) ° °[facility] ([phone]) ° °[facility] ([phone]) °This clinic caters to the indigent population and is on a lottery system. °Location: °[facility], [address], [place] °Clinic Hours: °Wednesdays from 6pm - 9pm, patients seen by a lottery system. °For dates, call or go to www.med.[facility].edu/[facility]/patients/Dental-[facility] °Services: °Cleanings, fillings and simple extractions. °Payment Options: °DENTAL WORK IS FREE OF CHARGE. Bring proof of income or support. °Best way to get seen: °Arrive at 5:15 pm - this is a lottery, NOT first come/first serve, so arriving earlier will not increase your chances of being seen. °  °  °[facility] °[phone] °Select option 1 for emergencies °  °Location: °[facility], [address], [place] °Clinic Hours: °No walk-ins accepted - call the day before to schedule an appointment. °Check in times are 9:30 am and 1:30 pm. °Services: °Simple extractions, temporary fillings, pulpectomy/pulp debridement, uncomplicated abscess drainage. °Payment Options: °PAYMENT IS DUE AT THE TIME OF SERVICE.  Fee is usually $100-200, additional surgical procedures (e.g. abscess drainage) may be extra. °Cash, checks, Visa/MasterCard accepted.  Can file Medicaid if patient is covered for dental - patient should call case worker to check. °No discount for [facility] patients. °Best way to get seen: °MUST call the day before and get onto the schedule. Can usually be seen the next 1-2 days. No walk-ins accepted. °  °  °[facility] °[phone] °   °Location: °[facility], [address], [place] °Clinic Hours: °M, W, Th, F 8am or 1:30pm, Tues 9a or 1:30 - first come/first served. °Services: °Simple extractions, temporary fillings, uncomplicated abscess drainage.  You do not need to be an [place] resident. °Payment Options: °PAYMENT IS DUE AT THE TIME OF SERVICE. °Dental insurance, otherwise sliding scale - bring proof of income or support. °Depending on income and treatment needed, cost is usually $50-200. °Best way to get seen: °Arrive early as it is first come/first served. °  °  °[facility] °[phone] °  °Location: °[address] °Clinic Hours: °Mon-Thu 8a-5p °Services: °Most basic dental services including extractions and fillings. °Payment Options: °PAYMENT IS DUE AT THE TIME OF SERVICE. °Sliding scale, up to 50% off - bring proof if income or support. °Medicaid with dental option accepted. °Best way to get seen: °Call to schedule an appointment, can usually be seen within 2 weeks OR they will try to see walk-ins - show up at 8a or 2p (you may have to wait). °  °  °[facility] °919-245-2435 °[place] RESIDENTS ONLY °  °Location: °[facility], [address], [place], [place] [address] °Clinic Hours: By appointment only. °Monday - Thursday 8am-5pm, Friday 8am-12pm °Services: Cleanings, fillings, extractions. °Payment Options: °PAYMENT IS DUE AT THE TIME OF SERVICE. °Cash, Visa or MasterCard. Sliding scale - $30 minimum per service. °Best way to get seen: °Come in to office, complete packet and make an appointment - need proof of income °or support monies for each household member and proof of [place] residence. °Usually takes about a month to get in. °  °  °[facility] °[phone] °  °Location: °[address]   [place] °Clinic Hours: Walk-in Urgent Care Dental Services are offered Monday-Friday  mornings only. °The numbers of emergencies accepted daily is limited to the number of °providers available. °Maximum 15 - Mondays, Wednesdays & Thursdays °Maximum 10 - Tuesdays & Fridays °Services: °You do not need to be a  [place] resident to be seen for a dental emergency. °Emergencies are defined as pain, swelling, abnormal bleeding, or dental trauma. Walkins will receive x-rays if needed. °NOTE: Dental cleaning is not an emergency. °Payment Options: °PAYMENT IS DUE AT THE TIME OF SERVICE. °Minimum co-pay is $40.00 for uninsured patients. °Minimum co-pay is $3.00 for Medicaid with dental coverage. °Dental Insurance is accepted and must be presented at time of visit. °Medicare does not cover dental. °Forms of payment: Cash, credit card, checks. °Best way to get seen: °If not previously registered with the clinic, walk-in dental registration begins at 7:15 am and is on a first come/first serve basis. °If previously registered with the clinic, call to make an appointment. °  °  °[facility] °[phone] °[place] RESIDENTS ONLY °  °Location: °[address], [place], [place] °Clinic Hours: °Mon-Thu 10a-2p °Services: Extractions only! °Payment Options: °FREE (donations accepted) - bring proof of income or support °Best way to get seen: °Call and schedule an appointment OR come at 8am on the 1st Monday of every month (except for holidays) when it is first come/first served. °  °  °[facility] °[phone] °  °Location: °[address], [place] °Clinic Hours: °Friday mornings °Services, Payment Options, Best way to get seen: °Call for info °

## 2017-10-04 NOTE — ED Notes (Signed)
PT is very tearful in triage. Standing pain medication ordered.

## 2017-10-04 NOTE — ED Provider Notes (Signed)
[facility] Emergency Department Provider Note  ____________________________________________   First MD Initiated Contact with Patient 10/04/17 2231     (approximate)  I have reviewed the triage vital signs and the nursing notes.   HISTORY  Chief Complaint Dental Pain    HPI Frankey Pootierra Rambeau is a 26 y.o. female complains of left upper gum and tooth pain, she states that part of the tooth broke off 2 years ago and then last year the rest of the tooth broke off, she has not seen a dentist to have the root of the tooth removed, she states she is starting to have really severe pain in the left upper chest wall due to the tooth, she does not have fever chills, she does not have chest pain or shortness of breath, she does not have a regular dentist, she has taken ibuprofen without any relief  Past Medical History:  Diagnosis Date  . Sickle cell trait (HCC)     There are no active problems to display for this patient.   Past Surgical History:  Procedure Laterality Date  . CESAREAN SECTION      Prior to Admission medications   Medication Sig Start Date End Date Taking? Authorizing Provider  amoxicillin (AMOXIL) 500 MG capsule Take 1 capsule (500 mg total) by mouth 3 (three) times daily. 10/04/17   Fisher, Roselyn BeringSusan W, PA-C  ibuprofen (ADVIL,MOTRIN) 800 MG tablet Take 1 tablet (800 mg total) by mouth every 8 (eight) hours as needed. 10/04/17   Sherrie MustacheFisher, Roselyn BeringSusan W, PA-C  traMADol (ULTRAM) 50 MG tablet Take 1 tablet (50 mg total) by mouth every 6 (six) hours as needed. 10/04/17   Faythe GheeFisher, Susan W, PA-C    Allergies Patient has no known allergies.  History reviewed. No pertinent family history.  Social History Social History   Tobacco Use  . Smoking status: Never Smoker  . Smokeless tobacco: Never Used  Substance Use Topics  . Alcohol use: No  . Drug use: No    Review of Systems  Constitutional: No fever/chills Eyes: No visual changes. ENT: No sore throat.   Positive for dental pain Respiratory: Denies cough Genitourinary: Negative for dysuria. Musculoskeletal: Negative for back pain. Skin: Negative for rash.    ____________________________________________   PHYSICAL EXAM:  VITAL SIGNS: ED Triage Vitals  Enc Vitals Group     BP 10/04/17 2052 125/84     Pulse Rate 10/04/17 2052 72     Resp 10/04/17 2052 16     Temp 10/04/17 2052 99.2 F (37.3 C)     Temp Source 10/04/17 2052 Oral     SpO2 10/04/17 2052 99 %     Weight 10/04/17 2058 180 lb (81.6 kg)     Height 10/04/17 2052 5\' 3"  (1.6 m)     Head Circumference --      Peak Flow --      Pain Score 10/04/17 2057 10     Pain Loc --      Pain Edu? --      Excl. in GC? --     Constitutional: Alert and oriented. Well appearing and in no acute distress. Eyes: Conjunctivae are normal.  Head: Atraumatic. Nose: No congestion/rhinnorhea. Mouth/Throat: Mucous membranes are moist.  Throat is normal, left upper gum is a little swollen, there are remnants of the tooth noted in the space where the molar was, there is no drainage at this time Cardiovascular: Normal rate, regular rhythm.  Heart sounds are normal Respiratory: Normal respiratory  effort.  No retractions, lungs are clear to auscultation GU: deferred Musculoskeletal: FROM all extremities, warm and well perfused Neurologic:  Normal speech and language.  Skin:  Skin is warm, dry and intact. No rash noted. Psychiatric: Mood and affect are normal. Speech and behavior are normal.  ____________________________________________   LABS (all labs ordered are listed, but only abnormal results are displayed)  Labs Reviewed - No data to display ____________________________________________   ____________________________________________  RADIOLOGY    ____________________________________________   PROCEDURES  Procedure(s) performed: No     ____________________________________________   INITIAL IMPRESSION / ASSESSMENT  AND PLAN / ED COURSE  Pertinent labs & imaging results that were available during my care of the patient were reviewed by me and considered in my medical decision making (see chart for details).  Patient is a 26 year old female comes to the ED complaining of left upper tooth pain, previous history of a broken tooth in the area, diagnosis is dental pain, she was advised to see a dentist that can refer her to an oral surgeon to have the remainder of the tooth removed, she was given a prescription for amoxicillin 500 mg 3 times daily, ibuprofen 800 mg 3 times daily, and tramadol 50 mg #10 with no refill, she is to apply ice to the jaw, she was given a list of dental clinics that will help people without insurance, the patient states she will call or the clinics in follow-up, she states she will comply with our recommendations, she was discharged in stable condition     As part of my medical decision making, I reviewed the following data within the electronic MEDICAL RECORD NUMBERPast medical history and previous encounters ____________________________________________   FINAL CLINICAL IMPRESSION(S) / ED DIAGNOSES  Final diagnoses:  Dental infection      NEW MEDICATIONS STARTED DURING THIS VISIT:  This SmartLink is deprecated. Use AVSMEDLIST instead to display the medication list for a patient.   Note:  This document was prepared using Dragon voice recognition software and may include unintentional dictation errors.    Faythe GheeFisher, Susan W, PA-C 10/04/17 2300    Nita SickleVeronese, [place], MD 10/05/17 [id number]

## 2017-10-04 NOTE — ED Triage Notes (Signed)
Pt to ED reporting pain in upper left side of jaw. Pt report she had the tooth pulled earlier this year but the pain has returned yesterday and is worse today. PT in tears in triage. No fevers reported.

## 2019-08-10 ENCOUNTER — Telehealth: Payer: Self-pay

## 2019-08-10 NOTE — Telephone Encounter (Signed)
TC to [place] at [facility]. Patient had reactive plasma STS (Syphilis); confirmatory unknown. Aileen Fass, RN

## 2019-08-13 ENCOUNTER — Other Ambulatory Visit: Payer: Self-pay

## 2019-08-13 ENCOUNTER — Ambulatory Visit: Payer: Self-pay | Admitting: Advanced Practice Midwife

## 2019-08-13 DIAGNOSIS — B009 Herpesviral infection, unspecified: Secondary | ICD-10-CM

## 2019-08-13 DIAGNOSIS — Z113 Encounter for screening for infections with a predominantly sexual mode of transmission: Secondary | ICD-10-CM

## 2019-08-13 LAB — WET PREP FOR TRICH, YEAST, CLUE
Trichomonas Exam: NEGATIVE
Yeast Exam: NEGATIVE

## 2019-08-13 MED ORDER — VALACYCLOVIR HCL 500 MG PO TABS: 500.0000 mg | ORAL_TABLET | Freq: Two times a day (BID) | ORAL | 12 refills | Status: AC

## 2019-08-13 NOTE — Progress Notes (Signed)
Wet mount reviewed and is negative today so no treatment needed for wet mount per standing order. Pt received written rx for Valacyclovir written by Ola Spurr, CNM. Awaiting further test results. Provider orders completed.Ronny Bacon, RN

## 2019-08-13 NOTE — Progress Notes (Signed)
    STI clinic/screening visit  Subjective:  Laura White is a 28 y.o.MBF G6P3 exsmoker female being seen today for an STI screening visit. The patient reports they do not have symptoms. Here because was donating blood at [facility] and told she might have syphyllis and to come here for testing.  Patient has the following medical conditions:  There are no active problems to display for this patient.    No chief complaint on file.   HPI  Patient reports no symptoms but was going to donate blood at [facility] and told she might have syphillis and should come here for testing See flowsheet for further details and programmatic requirements.    The following portions of the patient's history were reviewed and updated as appropriate: allergies, current medications, past medical history, past social history, past surgical history and problem list.  Objective:  There were no vitals filed for this visit.  Physical Exam Vitals signs and nursing note reviewed.  Constitutional:      Appearance: Normal appearance.  HENT:     Head: Normocephalic and atraumatic.     Mouth/Throat:     Mouth: Mucous membranes are moist.     Pharynx: Oropharynx is clear. No oropharyngeal exudate or posterior oropharyngeal erythema.  Pulmonary:     Effort: Pulmonary effort is normal.  Abdominal:     General: Abdomen is flat.     Palpations: There is no mass.     Tenderness: There is no abdominal tenderness. There is no rebound.     Comments: Poor tone, soft without tenderness  Genitourinary:    General: Normal vulva.     Exam position: Lithotomy position.     Pubic Area: No rash or pubic lice.      Labia:        Right: No rash or lesion.        Left: No rash or lesion.      Vagina: Bleeding (moderate red blood from menses, ph>4.5) present. No vaginal discharge, erythema or lesions.     Cervix: No cervical motion tenderness, discharge, friability, lesion or erythema.     Uterus: Normal.      Adnexa:  Right adnexa normal and left adnexa normal.     Rectum: Normal.  Lymphadenopathy:     Head:     Right side of head: No preauricular or posterior auricular adenopathy.     Left side of head: No preauricular or posterior auricular adenopathy.     Cervical: No cervical adenopathy.     Upper Body:     Right upper body: No supraclavicular or axillary adenopathy.     Left upper body: No supraclavicular or axillary adenopathy.     Lower Body: No right inguinal adenopathy. No left inguinal adenopathy.  Skin:    General: Skin is warm and dry.     Findings: No rash.  Neurological:     Mental Status: She is alert and oriented to person, place, and time.       Assessment and Plan:  Laura White is a 28 y.o. female presenting to the [facility] Department for STI screening  1. Screening examination for venereal disease Treat wet mount per standing orders Immunization nurse consult - WET PREP FOR [place], YEAST, CLUE - Syphilis Serology, [facility] - HIV/HCV [place] [facility] - HBV [facility]     No follow-ups on file.  No future appointments.  Herbie Saxon, CNM

## 2019-08-20 ENCOUNTER — Telehealth: Payer: Self-pay | Admitting: Family Medicine

## 2019-08-20 LAB — HM HIV SCREENING LAB: HM HIV Screening: NEGATIVE

## 2019-08-20 LAB — HM HEPATITIS C SCREENING LAB: HM Hepatitis Screen: NEGATIVE

## 2019-08-20 LAB — HEPATITIS B SURFACE ANTIGEN

## 2019-08-20 NOTE — Telephone Encounter (Signed)
TC to patient. Verified ID via password/SS#. Informed of positive syphilis, need for tx and re-draw. Instructed to eat before visit and have partner call for tx appt. Appt scheduled.Aileen Fass, RN

## 2019-08-20 NOTE — Telephone Encounter (Signed)
Attempted TC back to patient after she left message for RN to call her back at 4pm. LM to call RN back. Aileen Fass, RN

## 2019-08-20 NOTE — Telephone Encounter (Signed)
Attempted TC to patient.  Left generic message to call RN back. Aileen Fass, RN   Per [facility] website- HIV & GC/Chlam negative.  RPR reactive with 1:32 titer. Patient needs tx and re-draw per C. [place] PA. Aileen Fass, RN

## 2019-08-20 NOTE — Telephone Encounter (Signed)
pls call regarding test results

## 2019-08-23 ENCOUNTER — Encounter: Payer: Self-pay | Admitting: Physician Assistant

## 2019-08-23 ENCOUNTER — Ambulatory Visit: Payer: Self-pay | Admitting: Physician Assistant

## 2019-08-23 ENCOUNTER — Other Ambulatory Visit: Payer: Self-pay

## 2019-08-23 DIAGNOSIS — Z712 Person consulting for explanation of examination or test findings: Secondary | ICD-10-CM

## 2019-08-23 DIAGNOSIS — Z113 Encounter for screening for infections with a predominantly sexual mode of transmission: Secondary | ICD-10-CM

## 2019-08-23 DIAGNOSIS — A539 Syphilis, unspecified: Secondary | ICD-10-CM

## 2019-08-23 MED ORDER — PENICILLIN G BENZATHINE 1200000 UNIT/2ML IM SUSP: 1.2000 10*6.[IU] | Freq: Once | INTRAMUSCULAR | Status: DC

## 2019-08-23 MED ORDER — PENICILLIN G BENZATHINE 1200000 UNIT/2ML IM SUSP
2.4000 10*6.[IU] | Freq: Once | INTRAMUSCULAR | Status: AC
  Administered 2019-08-23: 12:00:00 2.4 10*6.[IU] via INTRAMUSCULAR

## 2019-08-23 NOTE — Telephone Encounter (Signed)
TC to DIS.  Aware of patient's results and appt for today Aileen Fass, RN

## 2019-08-23 NOTE — Progress Notes (Signed)
S:  Patient into clinic to discuss test results and get treatment for Syphilis.  Patient denies symptoms today. Denies history of lesion, rash, hair loss.  Reports that she has an uncomfortable feeling around her rectum.   O:  WDWN female in NAD, A&O x 3; skin= warm and dry without rashes or lesions; abd=soft, nt, no guarding, rebound or masses; external genitalia=normal female without lesions; perianal area with possible hemorrhoid without tenderness, or bleeding present today. A/P:  1.  Syphilis needs treatment 2.  Counseled patient re:  Syphilis- dz, transmission, common symptoms, asymptomatic spread and sequelae if not treated.  Counseled re:  Importance of determining how long she may have had infection to determine appropriate treatment. 3.  Will treat with Bicillin 2.4mu IM today 4.  No sex for 14 days and until after partner has completed treatment. 5.  RN will call to let her know if she needs to RTC next Monday for another treatment. 6.  Rec condoms with all sex 7.  Will need to RTC for titer recheck at 6 months, 12 months and annually.

## 2019-08-26 ENCOUNTER — Telehealth: Payer: Self-pay | Admitting: Family Medicine

## 2019-08-26 NOTE — Telephone Encounter (Signed)
Pt wants test results

## 2019-08-26 NOTE — Telephone Encounter (Signed)
TC to patient re: RPR results.  Per [facility] patient's titer is still 1:32. Patient will need 2 other Bicillin appts per C. [place] based on new titer results. Patient scheduled. Aileen Fass, RN

## 2019-08-27 ENCOUNTER — Other Ambulatory Visit: Payer: Self-pay | Admitting: Physician Assistant

## 2019-08-27 DIAGNOSIS — A539 Syphilis, unspecified: Secondary | ICD-10-CM

## 2019-08-27 MED ORDER — PENICILLIN G BENZATHINE 1200000 UNIT/2ML IM SUSP
2.4000 10*6.[IU] | INTRAMUSCULAR | Status: AC
  Administered 2019-08-30 – 2019-09-06 (×2): 2.4 10*6.[IU] via INTRAMUSCULAR

## 2019-08-27 NOTE — Progress Notes (Signed)
Patient had Reactive RPR with confirmatory testing reactive on and was retested on 08/23/2019, and given her first set of Bicillin 2.32mu IM at that visit.  The RPR remained at 1:32, so patient needs to RTC for Bicillin 2.11mu IM on 08/30/2019 and 09/06/2019 for her second and third sets of shots for Syphilis of unknown duration.

## 2019-08-30 ENCOUNTER — Ambulatory Visit: Payer: Self-pay | Admitting: Physician Assistant

## 2019-08-30 ENCOUNTER — Other Ambulatory Visit: Payer: Self-pay

## 2019-08-30 ENCOUNTER — Encounter: Payer: Self-pay | Admitting: Physician Assistant

## 2019-08-30 DIAGNOSIS — A539 Syphilis, unspecified: Secondary | ICD-10-CM

## 2019-08-30 NOTE — Progress Notes (Signed)
Bicillin 1.32mu IM given in right glute by this RN. Pt tolerated well.Ronny Bacon, RN

## 2019-08-30 NOTE — Progress Notes (Addendum)
Patient here today for second set of Bicillin injections. Denies any questions or concerns. Counseled to continue abstinence from sex until at least 2 weeks after completion of treatment for Syphilis. Patient agreeable to do so. 1 injection given by this RN and second injection given by B. Creasy, RN and tolerated well. Reviewed with patient next 09/06/2019 4:10 scheduled appt for third set of injections. Hal Morales, RN

## 2019-09-06 ENCOUNTER — Ambulatory Visit: Payer: Self-pay

## 2019-09-06 ENCOUNTER — Other Ambulatory Visit: Payer: Self-pay

## 2019-09-06 DIAGNOSIS — A539 Syphilis, unspecified: Secondary | ICD-10-CM

## 2019-09-06 NOTE — Progress Notes (Signed)
Pt states she is here for her last set of bicillin shots and she did not have any problems with the first and second set of Bicillin shots. Administered Bicillin LA 2.4 mu IM today per Antoine Primas, PA order dated 08/27/2019 (3rd set today). Pt to RTC in 6 months for syphilis bloodwork and then every year.

## 2020-04-25 ENCOUNTER — Ambulatory Visit: Payer: Self-pay

## 2020-06-19 ENCOUNTER — Ambulatory Visit: Payer: Self-pay | Admitting: Physician Assistant

## 2020-06-19 ENCOUNTER — Other Ambulatory Visit: Payer: Self-pay

## 2020-06-19 ENCOUNTER — Ambulatory Visit: Payer: Self-pay

## 2020-06-19 DIAGNOSIS — Z113 Encounter for screening for infections with a predominantly sexual mode of transmission: Secondary | ICD-10-CM

## 2020-06-19 DIAGNOSIS — Z202 Contact with and (suspected) exposure to infections with a predominantly sexual mode of transmission: Secondary | ICD-10-CM

## 2020-06-19 DIAGNOSIS — Z299 Encounter for prophylactic measures, unspecified: Secondary | ICD-10-CM

## 2020-06-19 LAB — WET PREP FOR TRICH, YEAST, CLUE
Trichomonas Exam: NEGATIVE
Yeast Exam: NEGATIVE

## 2020-06-19 MED ORDER — AZITHROMYCIN 500 MG PO TABS
1000.0000 mg | ORAL_TABLET | Freq: Once | ORAL | Status: AC
  Administered 2020-06-19: 1000 mg via ORAL

## 2020-06-19 MED ORDER — PENICILLIN G BENZATHINE 1200000 UNIT/2ML IM SUSP
2.4000 10*6.[IU] | Freq: Once | INTRAMUSCULAR | Status: AC
  Administered 2020-06-19: 2.4 10*6.[IU] via INTRAMUSCULAR

## 2020-06-19 NOTE — Progress Notes (Signed)
[employer] results reviewed. Per standing orders no treatment indicated. Patient treated today Syphilis and contact to NGU. Tawny Hopping, RN

## 2020-06-21 ENCOUNTER — Encounter: Payer: Self-pay | Admitting: Physician Assistant

## 2020-06-21 NOTE — Progress Notes (Signed)
[facility] Department STI clinic/screening visit  Subjective:  Laura White is a 29 y.o. female being seen today for an STI screening visit. The patient reports they do have symptoms.  Patient reports that they do not desire a pregnancy in the next year.   They reported they are not interested in discussing contraception today.  Patient's last menstrual period was 06/07/2020.   Patient has the following medical conditions:   Patient Active Problem List   Diagnosis Date Noted  . Syphilis 08/30/2019  . HSV (herpes simplex virus) infection 08/13/2019  . Sickle cell trait (HCC) 08/15/2015    Chief Complaint  Patient presents with  . SEXUALLY TRANSMITTED DISEASE    screening    HPI  Patient reports that she has noticed muscle aches and scabs off and on for a few months.  Reports that she has medicine for HSV outbreaks that she takes as needed.  Reports that she has last used vaginal gel for BV last Thursday.  States that she last had her HIV test and pap in 07/2019.  Reports that when she was treated for Syphilis last year, she and her partner did not abstain after treatment so she is sure she has been reinfected.   See flowsheet for further details and programmatic requirements.    The following portions of the patient's history were reviewed and updated as appropriate: allergies, current medications, past medical history, past social history, past surgical history and problem list.  Objective:  There were no vitals filed for this visit.  Physical Exam Constitutional:      General: She is not in acute distress.    Appearance: Normal appearance.  HENT:     Head: Normocephalic and atraumatic.     Comments: No nits, lice or hair loss. No cervical, supraclavicular or axillary adenopathy.    Mouth/Throat:     Mouth: Mucous membranes are moist.     Pharynx: Oropharynx is clear. No oropharyngeal exudate or posterior oropharyngeal erythema.  Eyes:      Conjunctiva/sclera: Conjunctivae normal.  Pulmonary:     Effort: Pulmonary effort is normal.  Abdominal:     Palpations: Abdomen is soft. There is no mass.     Tenderness: There is no abdominal tenderness. There is no guarding or rebound.  Genitourinary:    General: Normal vulva.     Rectum: Normal.     Comments: External genitalia/pubic area without nits, lice, edema, erythema, or inguinal adenopathy. Vagina with normal mucosa and discharge. Cervix without visible lesions. Uterus firm, mobile, nt, no masses, no CMT, no adnexal tenderness or fullness. Musculoskeletal:     Cervical back: Neck supple. No tenderness.  Skin:    General: Skin is warm and dry.     Findings: No bruising, erythema, lesion or rash.  Neurological:     Mental Status: She is alert and oriented to person, place, and time.  Psychiatric:        Mood and Affect: Mood normal.        Behavior: Behavior normal.        Thought Content: Thought content normal.        Judgment: Judgment normal.      Assessment and Plan:  Laura White is a 29 y.o. female presenting to the [facility] Department for STI screening  1. Screening for STD (sexually transmitted disease) Patient into clinic with symptoms. Rec condoms with all sex. Await test results.  Counseled that RN will call if needs to RTC for treatment  once results are back. - WET PREP FOR TRICH, YEAST, CLUE - Chlamydia/Gonorrhea [place] [facility] - HIV [place] - Syphilis Serology, [facility] - azithromycin (ZITHROMAX) tablet 1,000 mg  2. Prophylactic measure Will re-treat for Syphilis with Bicillin 2.4 mu IM today due to re-exposure after treatment. No sex for 14 days and until after partner completes treatment. - penicillin g benzathine (BICILLIN LA) 1200000 UNIT/2ML injection 2.4 Million Units  3. Venereal disease contact Treat as a contact to NGU with Azithromycin 1g po DOT today.      No follow-ups on file.  No future  appointments.  Matt Holmes, PA

## 2020-06-23 ENCOUNTER — Encounter: Payer: Self-pay | Admitting: Physician Assistant

## 2020-06-26 ENCOUNTER — Telehealth: Payer: Self-pay

## 2020-06-26 NOTE — Telephone Encounter (Signed)
Phone call to pt per pt request from patient advice request message (see message from 06/23/2020). Pt answered and reports that she wanted to make sure her phone number was updated in our system and would like to find out TR's from 06/19/2020 visit. Verified I was speaking with pt with pt name, DOB and password. Counseled pt that we have updated her phone number and counseled pt regarding her negative TR's for GC/Chlamydia, and HIV. Counseled pt that TR for Syphilis has not come back yet, so we still do not know the TR for that. Pt states understanding. Offered to set up TR appt for pt so she can get printed off copies of TR's, but pt declines at this time. Pt with no further questions or concerns at this time.

## 2021-01-18 ENCOUNTER — Ambulatory Visit: Payer: Medicaid Other

## 2021-02-20 ENCOUNTER — Ambulatory Visit: Payer: Medicaid Other | Admitting: Physician Assistant

## 2021-02-20 ENCOUNTER — Other Ambulatory Visit: Payer: Self-pay

## 2021-02-20 DIAGNOSIS — Z113 Encounter for screening for infections with a predominantly sexual mode of transmission: Secondary | ICD-10-CM

## 2021-02-20 LAB — WET PREP FOR TRICH, YEAST, CLUE
Trichomonas Exam: NEGATIVE
Yeast Exam: NEGATIVE

## 2021-02-20 NOTE — Progress Notes (Signed)
Wet mount reviewed by provider, no tx per provider orders. Provider orders completed. 

## 2021-02-22 ENCOUNTER — Encounter: Payer: Self-pay | Admitting: Physician Assistant

## 2021-02-22 NOTE — Progress Notes (Signed)
  [facility] Department STI clinic/screening visit  Subjective:  Marialice Newkirk is a 30 y.o. female being seen today for an STI screening visit. The patient reports they do not have symptoms.  Patient reports that they do not desire a pregnancy in the next year.   They reported they are not interested in discussing contraception today.  No LMP recorded.   Patient has the following medical conditions:   Patient Active Problem List   Diagnosis Date Noted  . Syphilis 08/30/2019  . HSV (herpes simplex virus) infection 08/13/2019  . Sickle cell trait (HCC) 08/15/2015    Chief Complaint  Patient presents with  . SEXUALLY TRANSMITTED DISEASE    screening    HPI  Patient reports that she has had a clear/white discharge for about 1 month.  States that she is currently on her period today.  Denies chronic conditions and that she takes Gabapentin.  Reports last HIV test was in 2021 and last pap was    See flowsheet for further details and programmatic requirements.    The following portions of the patient's history were reviewed and updated as appropriate: allergies, current medications, past medical history, past social history, past surgical history and problem list.  Objective:  There were no vitals filed for this visit.  Physical Exam Constitutional:      General: She is not in acute distress.    Appearance: Normal appearance.  HENT:     Head: Normocephalic and atraumatic.  Eyes:     Conjunctiva/sclera: Conjunctivae normal.  Pulmonary:     Effort: Pulmonary effort is normal.  Abdominal:     Palpations: There is no mass.  Skin:    General: Skin is warm and dry.  Neurological:     Mental Status: She is alert and oriented to person, place, and time.  Psychiatric:        Mood and Affect: Mood normal.        Behavior: Behavior normal.        Thought Content: Thought content normal.        Judgment: Judgment normal.      Assessment and Plan:  Scarlette Hogston is a  30 y.o. female presenting to the [facility] Department for STI screening  1. Screening for STD (sexually transmitted disease) Patient into clinic with symptoms. Patient declines pelvic exam by provider and opts to self collect vaginal samples.  Counseled how to collect samples for accurate results. Counseled patient that vaginal bleeding may affect accuracy of a wet mount and that if the results is negative, she should RTC for repeat testing after period has stopped. Rec condoms with all sex. Await test results.  Counseled that RN will call if needs to RTC for treatment once results are back. - WET PREP FOR TRICH, YEAST, CLUE - Gonococcus culture - Chlamydia/Gonorrhea [facility] - Syphilis Serology, [facility]     No follow-ups on file.  No future appointments.  Matt Holmes, PA

## 2021-02-24 LAB — GONOCOCCUS CULTURE
